# Patient Record
Sex: Female | Born: 1960
Health system: Southern US, Community
[De-identification: ages and names within clinical notes are randomized; demographics above are authoritative.]

---

## 2000-11-29 ENCOUNTER — Other Ambulatory Visit: Admission: RE | Admit: 2000-11-29 | Discharge: 2000-11-29 | Payer: Self-pay | Admitting: Obstetrics and Gynecology

## 2001-12-21 ENCOUNTER — Other Ambulatory Visit: Admission: RE | Admit: 2001-12-21 | Discharge: 2001-12-21 | Payer: Self-pay | Admitting: Obstetrics and Gynecology

## 2003-03-01 ENCOUNTER — Other Ambulatory Visit: Admission: RE | Admit: 2003-03-01 | Discharge: 2003-03-01 | Payer: Self-pay | Admitting: Obstetrics and Gynecology

## 2004-04-22 ENCOUNTER — Other Ambulatory Visit: Admission: RE | Admit: 2004-04-22 | Discharge: 2004-04-22 | Payer: Self-pay | Admitting: Obstetrics and Gynecology

## 2005-05-28 ENCOUNTER — Other Ambulatory Visit: Admission: RE | Admit: 2005-05-28 | Discharge: 2005-05-28 | Payer: Self-pay | Admitting: Obstetrics and Gynecology

## 2006-08-10 ENCOUNTER — Other Ambulatory Visit: Admission: RE | Admit: 2006-08-10 | Discharge: 2006-08-10 | Payer: Self-pay | Admitting: Obstetrics and Gynecology

## 2011-05-22 ENCOUNTER — Other Ambulatory Visit: Payer: Self-pay | Admitting: Family Medicine

## 2011-05-25 ENCOUNTER — Other Ambulatory Visit (HOSPITAL_COMMUNITY)
Admission: RE | Admit: 2011-05-25 | Discharge: 2011-05-25 | Disposition: A | Discharge status: Home / Self Care | Payer: BC Managed Care – PPO | Source: Ambulatory Visit | Attending: Family Medicine | Admitting: Family Medicine

## 2011-05-25 DIAGNOSIS — Z0000 Encounter for general adult medical examination without abnormal findings: Secondary | ICD-10-CM | POA: Insufficient documentation

## 2012-09-19 ENCOUNTER — Other Ambulatory Visit: Payer: Self-pay | Admitting: Gastroenterology

## 2014-05-15 ENCOUNTER — Other Ambulatory Visit (HOSPITAL_COMMUNITY)
Admission: RE | Admit: 2014-05-15 | Discharge: 2014-05-15 | Disposition: A | Discharge status: Home / Self Care | Payer: BLUE CROSS/BLUE SHIELD | Source: Ambulatory Visit | Attending: Family Medicine | Admitting: Family Medicine

## 2014-05-15 ENCOUNTER — Other Ambulatory Visit: Payer: Self-pay | Admitting: Family Medicine

## 2014-05-15 DIAGNOSIS — Z01419 Encounter for gynecological examination (general) (routine) without abnormal findings: Secondary | ICD-10-CM | POA: Insufficient documentation

## 2014-05-15 DIAGNOSIS — R599 Enlarged lymph nodes, unspecified: Secondary | ICD-10-CM

## 2014-05-16 LAB — CYTOLOGY - PAP

## 2014-05-21 ENCOUNTER — Ambulatory Visit
Admission: RE | Admit: 2014-05-21 | Discharge: 2014-05-21 | Disposition: A | Discharge status: Home / Self Care | Payer: BLUE CROSS/BLUE SHIELD | Source: Ambulatory Visit | Attending: Family Medicine | Admitting: Family Medicine

## 2014-05-21 DIAGNOSIS — R599 Enlarged lymph nodes, unspecified: Secondary | ICD-10-CM

## 2016-03-26 DIAGNOSIS — Z86010 Personal history of colonic polyps: Secondary | ICD-10-CM | POA: Diagnosis not present

## 2016-03-26 DIAGNOSIS — K640 First degree hemorrhoids: Secondary | ICD-10-CM | POA: Diagnosis not present

## 2016-03-26 DIAGNOSIS — D126 Benign neoplasm of colon, unspecified: Secondary | ICD-10-CM | POA: Diagnosis not present

## 2016-04-24 DIAGNOSIS — H34219 Partial retinal artery occlusion, unspecified eye: Secondary | ICD-10-CM | POA: Diagnosis not present

## 2016-10-06 DIAGNOSIS — Z803 Family history of malignant neoplasm of breast: Secondary | ICD-10-CM | POA: Diagnosis not present

## 2016-10-06 DIAGNOSIS — Z1231 Encounter for screening mammogram for malignant neoplasm of breast: Secondary | ICD-10-CM | POA: Diagnosis not present

## 2016-10-14 DIAGNOSIS — H5212 Myopia, left eye: Secondary | ICD-10-CM | POA: Diagnosis not present

## 2016-11-30 DIAGNOSIS — Z23 Encounter for immunization: Secondary | ICD-10-CM | POA: Diagnosis not present

## 2016-11-30 DIAGNOSIS — Z0000 Encounter for general adult medical examination without abnormal findings: Secondary | ICD-10-CM | POA: Diagnosis not present

## 2016-11-30 DIAGNOSIS — E782 Mixed hyperlipidemia: Secondary | ICD-10-CM | POA: Diagnosis not present

## 2017-12-14 ENCOUNTER — Other Ambulatory Visit (HOSPITAL_COMMUNITY)
Admission: RE | Admit: 2017-12-14 | Discharge: 2017-12-14 | Disposition: A | Discharge status: Home / Self Care | Payer: 59 | Source: Ambulatory Visit | Attending: Family Medicine | Admitting: Family Medicine

## 2017-12-14 ENCOUNTER — Other Ambulatory Visit: Payer: Self-pay | Admitting: Family Medicine

## 2017-12-14 DIAGNOSIS — E782 Mixed hyperlipidemia: Secondary | ICD-10-CM | POA: Diagnosis not present

## 2017-12-14 DIAGNOSIS — Z124 Encounter for screening for malignant neoplasm of cervix: Secondary | ICD-10-CM | POA: Insufficient documentation

## 2017-12-14 DIAGNOSIS — L659 Nonscarring hair loss, unspecified: Secondary | ICD-10-CM | POA: Diagnosis not present

## 2017-12-14 DIAGNOSIS — Z0000 Encounter for general adult medical examination without abnormal findings: Secondary | ICD-10-CM | POA: Diagnosis not present

## 2017-12-14 DIAGNOSIS — M25511 Pain in right shoulder: Secondary | ICD-10-CM | POA: Diagnosis not present

## 2017-12-15 LAB — CYTOLOGY - PAP
DIAGNOSIS: NEGATIVE
HPV: NOT DETECTED

## 2017-12-22 DIAGNOSIS — Z803 Family history of malignant neoplasm of breast: Secondary | ICD-10-CM | POA: Diagnosis not present

## 2017-12-22 DIAGNOSIS — Z1231 Encounter for screening mammogram for malignant neoplasm of breast: Secondary | ICD-10-CM | POA: Diagnosis not present

## 2020-10-22 ENCOUNTER — Other Ambulatory Visit: Payer: Self-pay

## 2020-10-22 ENCOUNTER — Ambulatory Visit
Admission: RE | Admit: 2020-10-22 | Discharge: 2020-10-22 | Disposition: A | Discharge status: Home / Self Care | Payer: BC Managed Care – PPO | Source: Ambulatory Visit | Attending: Sports Medicine | Admitting: Sports Medicine

## 2020-10-22 ENCOUNTER — Ambulatory Visit (INDEPENDENT_AMBULATORY_CARE_PROVIDER_SITE_OTHER): Payer: BC Managed Care – PPO | Admitting: Sports Medicine

## 2020-10-22 VITALS — Ht 65.0 in | Wt 172.0 lb

## 2020-10-22 DIAGNOSIS — M5416 Radiculopathy, lumbar region: Secondary | ICD-10-CM

## 2020-10-22 DIAGNOSIS — M79605 Pain in left leg: Secondary | ICD-10-CM | POA: Diagnosis not present

## 2020-10-22 NOTE — Progress Notes (Addendum)
PCP: Carol Ada, MD  Subjective:   HPI: Patient is a very pleasant 60 y.o. female here for evaluation of left hip/lateral thigh pain.  Patient presents today for left low back and anterior lateral thigh pain.  Patient states this has been bothering her over the last 2-3 months.  She states that a few years ago she was moving her daughter into an apartment and picked up a box and felt a somewhat sharp pain in the left lateral low back and thigh, although this got better on its own with conservative treatment.  Over the past few years the left lateral thigh has flared up occasionally but would always get better with rest and ibuprofen.  She does run about 2 miles twice a week, and over the last few months it has simply not gotten better. She states the pain is located over the anterolateral thigh and will shoot down the anterior part of the thigh to the knee.  She states here recently she is also having some pain in the left lateral low back as well.  Her pain is exacerbated by bending down and lifting up objects, running, and walking up and down steps.  She is taking ibuprofen as needed which does help the pain.  She is also resting.  She denies any radicular pain shooting down the back of the leg or into the foot.  She denies any weakness or giving out of the leg/hip.  No fever chills or signs of systemic illness.  No past medical history on file.  No current outpatient medications on file prior to visit.   No current facility-administered medications on file prior to visit.    Allergies  Allergen Reactions   Sulfa Antibiotics Hives    Ht '5\' 5"'$  (1.651 m)   Wt 172 lb (78 kg)   BMI 28.62 kg/m   [facility] 10/22/2020  Frequency of aerobic exercise (# of days/week) 2  Average time in minutes 30  Frequency of strengthening activities (# of days/week) 1    No flowsheet data found.      Objective:  Physical Exam:  Gen: Well-appearing, in no acute distress;  non-toxic CV: Regular Rate. Well-perfused. Warm.  Resp: Breathing unlabored on room air; no wheezing. Psych: Fluid speech in conversation; appropriate affect; normal thought process Neuro: Sensation intact throughout. No gross coordination deficits.  MSK:  - Left hip: no TTP noted over the left greater trochanter or ASIS; erythema, ecchymosis or edema.  Mild tenderness to palpation over the tensor fascia latae. Range of motion of the hip full in all directions; strength 5/5 in all directions, although lateral hip abduction slightly weaker.  Nonantalgic gait.  No SI joint tenderness.  Negative FADIR, FABER's test.  Negative Ober's, Noble's test.  - Lumbar spine:  - Inspection: no gross deformity or scoliosis; no swelling or ecchymosis. No skin changes - Palpation: No TTP over the spinous processes, paraspinal muscles, or SI joints b/l - ROM: full active ROM of the lumbar spine in flexion; + mild pain with extension of the spine; + restriction in right SB - Strength: 5/5 strength of lower extremity in L4-S1 nerve root distributions b/l  *L1/L2: Hip Flexion & Abduction  *L3/L4: Knee Extension  *L4/L5: Ankle Dorsiflexion  *L5: Great Toe Extension  *S1: Ankle Plantar Flexion - Neuro: sensation intact in the L4-S1 nerve root distribution b/l, 2+ L4 and S1 reflexes - Provocative Testing: Positive straight leg raise of left with symptom reproduction in L3 dermatome; Modified  Slump Test    Assessment & Plan:  1. Left anterolateral LLE pain 2. Left sided lumbar radiculopathy  Patient has had acute on chronic anterolateral hip pain that radiates down the L3 dermatome. Her symptoms and exam are most indicative of cervicogenic lumbar etiology. She has no weakness on exam or posterior sciatica or red flag symptoms.   - 3-views of the lumbar spine xray ordered - Referral to physical therapy for low back pain (she will be leaving next week to travel to [place] to help take care of her father who is  having open heart surgery.  We will expedite this referral so that she has some home exercises to do while she is away) -Discussed activity modification; she may use ice/heat or over-the-counter anti-inflammatories for pain control as needed -We will follow-up in about 5 weeks after 1 month of formal/home PT to see if improved  Elba Barman, DO PGY-4, Sports Medicine Fellow [place]  Patient seen and evaluated with the sports medicine fellow.  I agree with the above plan of care.  X-rays of the lumbar spine show a grade 1 anterior listhesis of L3 on L4.  She also has disc space narrowing at L2-L3 and L4-L5 with advanced degenerative changes at L5-S1.  Proceed with treatment as above and follow-up in 4 to 5 weeks.  Future considerations could include an MRI to evaluate degree of foraminal stenosis present specifically at the L2-L3 area where she is clinically experiencing symptoms.

## 2020-11-26 ENCOUNTER — Ambulatory Visit (INDEPENDENT_AMBULATORY_CARE_PROVIDER_SITE_OTHER): Payer: BC Managed Care – PPO | Admitting: Sports Medicine

## 2020-11-26 VITALS — Ht 65.0 in | Wt 169.0 lb

## 2020-11-26 DIAGNOSIS — M5416 Radiculopathy, lumbar region: Secondary | ICD-10-CM | POA: Diagnosis not present

## 2020-11-26 NOTE — Patient Instructions (Signed)
It was great to see you today!  Please call [facility] at [phone] to schedule the MRI of your low back. We will call you with results once available.  Call us with any questions in the meantime.

## 2020-11-27 NOTE — Progress Notes (Signed)
   Subjective:    Patient ID: Mary Hooper, female    DOB: Jan 08, 1961, 60 y.o.   MRN: [id number]  HPI  Mary Hooper presents today for follow-up on left-sided low back pain and left leg radiculopathy.  She has noticed some improvement with physical therapy but nothing significant.  Her pain is most noticeable when crouching down and getting up from a crouched position.  Sitting is also more painful than walking.  She continues to localize her pain to the left side of her lumbar spine with radiating pain down into the left anterior thigh as well as some pain past the the knee into the proximal lateral aspect of the lower leg.  She is not sure whether or not she has noticed any weakness.  No numbness.  Ibuprofen does seem to help some. Recent x-rays of her lumbar spine showed a grade 1 anterior listhesis of L3 on L4 as well as disc space narrowing at L2-L3 and L4-L5.  There were advanced degenerative disc changes at L5-S1.   Review of Systems As above    Objective:   Physical Exam  Developed, well nourished.  No acute distress  Good lumbar range of motion.  Equivocal straight leg raise test on the left.  Strength is 5/5 in both lower extremities.  Sensation is intact to light touch grossly.  No noticeable atrophy.  X-rays as above      Assessment & Plan:   Left-sided low back pain and left leg sciatica secondary to lumbar degenerative disc disease  Given her failure to improve with conservative treatment thus far, I recommend an MRI of her lumbar spine specifically to rule out lumbar disc herniation.  Her symptoms may be originating from either the L3 or L4 nerve root.  Phone follow-up with those results when available.  We will likely proceed with a diagnostic/therapeutic lumbar ESI thereafter.  In the meantime, Mary Hooper may continue with physical therapy if she finds it to be helpful but I have given her a note to give to her physical therapist with instructions to avoid any sort  of lumbar flexion exercises and to instead stick with spine neutral or McKenzie extension exercises.  I also explained to Mary Hooper that any sort of activity that involves a lot of crouching or repetitive lumbar flexion may cause her symptoms to worsen.  She understands.  This note was dictated using Dragon naturally speaking software and may contain errors in syntax, spelling, or content which have not been identified prior to signing this note.

## 2020-12-02 ENCOUNTER — Other Ambulatory Visit: Payer: BC Managed Care – PPO

## 2020-12-02 ENCOUNTER — Encounter: Payer: Self-pay | Admitting: *Deleted

## 2020-12-15 ENCOUNTER — Ambulatory Visit
Admission: RE | Admit: 2020-12-15 | Discharge: 2020-12-15 | Disposition: A | Discharge status: Home / Self Care | Payer: BC Managed Care – PPO | Source: Ambulatory Visit | Attending: Sports Medicine | Admitting: Sports Medicine

## 2020-12-15 DIAGNOSIS — M5416 Radiculopathy, lumbar region: Secondary | ICD-10-CM

## 2020-12-18 ENCOUNTER — Other Ambulatory Visit: Payer: Self-pay | Admitting: Sports Medicine

## 2020-12-18 ENCOUNTER — Telehealth: Payer: Self-pay | Admitting: Sports Medicine

## 2020-12-18 ENCOUNTER — Other Ambulatory Visit: Payer: Self-pay

## 2020-12-18 DIAGNOSIS — M5410 Radiculopathy, site unspecified: Secondary | ICD-10-CM

## 2020-12-18 DIAGNOSIS — M79605 Pain in left leg: Secondary | ICD-10-CM

## 2020-12-18 DIAGNOSIS — M5416 Radiculopathy, lumbar region: Secondary | ICD-10-CM

## 2020-12-18 NOTE — Telephone Encounter (Signed)
  I spoke with Mary Hooper on the phone today after reviewing the MRI of her lumbar spine.  She has multilevel degenerative changes including left-sided foraminal stenosis at L3-L4.  Clinically, I think that is where her symptoms are arising.  I recommended a diagnostic/therapeutic ESI at [facility] imaging directed at this level with a subsequent phone follow-up with me approximately a week later.  She will also continue with her physical therapy.  We will decide on further work-up and treatment based on her response to the diagnostic ESI.

## 2020-12-23 ENCOUNTER — Other Ambulatory Visit: Payer: Self-pay

## 2020-12-23 ENCOUNTER — Ambulatory Visit
Admission: RE | Admit: 2020-12-23 | Discharge: 2020-12-23 | Disposition: A | Discharge status: Home / Self Care | Payer: BC Managed Care – PPO | Source: Ambulatory Visit | Attending: Sports Medicine | Admitting: Sports Medicine

## 2020-12-23 DIAGNOSIS — M5410 Radiculopathy, site unspecified: Secondary | ICD-10-CM

## 2020-12-23 MED ORDER — IOPAMIDOL (ISOVUE-M 200) INJECTION 41%
1.0000 mL | Freq: Once | INTRAMUSCULAR | Status: AC
  Administered 2020-12-23: 1 mL via EPIDURAL

## 2020-12-23 MED ORDER — METHYLPREDNISOLONE ACETATE 40 MG/ML INJ SUSP (RADIOLOG
80.0000 mg | Freq: Once | INTRAMUSCULAR | Status: AC
  Administered 2020-12-23: 80 mg via EPIDURAL

## 2020-12-23 NOTE — Discharge Instructions (Signed)

## 2021-04-19 ENCOUNTER — Encounter: Payer: Self-pay | Admitting: Sports Medicine

## 2021-04-21 ENCOUNTER — Other Ambulatory Visit: Payer: Self-pay

## 2021-04-21 ENCOUNTER — Other Ambulatory Visit: Payer: Self-pay | Admitting: Sports Medicine

## 2021-04-21 DIAGNOSIS — M5416 Radiculopathy, lumbar region: Secondary | ICD-10-CM

## 2021-04-21 DIAGNOSIS — M79605 Pain in left leg: Secondary | ICD-10-CM

## 2021-04-22 ENCOUNTER — Other Ambulatory Visit: Payer: BC Managed Care – PPO

## 2021-04-24 ENCOUNTER — Other Ambulatory Visit: Payer: BC Managed Care – PPO

## 2021-04-25 ENCOUNTER — Ambulatory Visit
Admission: RE | Admit: 2021-04-25 | Discharge: 2021-04-25 | Disposition: A | Discharge status: Home / Self Care | Payer: BC Managed Care – PPO | Source: Ambulatory Visit | Attending: Sports Medicine | Admitting: Sports Medicine

## 2021-04-25 DIAGNOSIS — M5416 Radiculopathy, lumbar region: Secondary | ICD-10-CM

## 2021-04-25 MED ORDER — METHYLPREDNISOLONE ACETATE 40 MG/ML INJ SUSP (RADIOLOG
80.0000 mg | Freq: Once | INTRAMUSCULAR | Status: AC
  Administered 2021-04-25: 80 mg via EPIDURAL

## 2021-04-25 MED ORDER — IOPAMIDOL (ISOVUE-M 200) INJECTION 41%
1.0000 mL | Freq: Once | INTRAMUSCULAR | Status: AC
  Administered 2021-04-25: 1 mL via EPIDURAL

## 2021-04-25 NOTE — Discharge Instructions (Signed)

## 2021-07-16 ENCOUNTER — Encounter: Payer: Self-pay | Admitting: Sports Medicine

## 2021-07-17 ENCOUNTER — Other Ambulatory Visit: Payer: Self-pay

## 2021-07-17 ENCOUNTER — Other Ambulatory Visit: Payer: Self-pay | Admitting: Sports Medicine

## 2021-07-17 DIAGNOSIS — M79605 Pain in left leg: Secondary | ICD-10-CM

## 2021-07-17 DIAGNOSIS — M5416 Radiculopathy, lumbar region: Secondary | ICD-10-CM

## 2021-07-25 ENCOUNTER — Ambulatory Visit
Admission: RE | Admit: 2021-07-25 | Discharge: 2021-07-25 | Disposition: A | Discharge status: Home / Self Care | Payer: BC Managed Care – PPO | Source: Ambulatory Visit | Attending: Sports Medicine | Admitting: Sports Medicine

## 2021-07-25 DIAGNOSIS — M5416 Radiculopathy, lumbar region: Secondary | ICD-10-CM

## 2021-07-25 MED ORDER — IOPAMIDOL (ISOVUE-M 200) INJECTION 41%
1.0000 mL | Freq: Once | INTRAMUSCULAR | Status: AC
  Administered 2021-07-25: 1 mL via EPIDURAL

## 2021-07-25 MED ORDER — METHYLPREDNISOLONE ACETATE 40 MG/ML INJ SUSP (RADIOLOG
80.0000 mg | Freq: Once | INTRAMUSCULAR | Status: AC
  Administered 2021-07-25: 80 mg via EPIDURAL

## 2021-07-25 NOTE — Discharge Instructions (Signed)

## 2021-10-13 ENCOUNTER — Encounter: Payer: Self-pay | Admitting: Sports Medicine

## 2021-10-14 ENCOUNTER — Other Ambulatory Visit: Payer: Self-pay | Admitting: *Deleted

## 2021-10-14 DIAGNOSIS — M5416 Radiculopathy, lumbar region: Secondary | ICD-10-CM

## 2021-10-16 ENCOUNTER — Other Ambulatory Visit: Payer: Self-pay | Admitting: Sports Medicine

## 2021-10-16 DIAGNOSIS — M5416 Radiculopathy, lumbar region: Secondary | ICD-10-CM

## 2021-10-30 ENCOUNTER — Ambulatory Visit
Admission: RE | Admit: 2021-10-30 | Discharge: 2021-10-30 | Disposition: A | Discharge status: Home / Self Care | Payer: BC Managed Care – PPO | Source: Ambulatory Visit | Attending: Sports Medicine | Admitting: Sports Medicine

## 2021-10-30 DIAGNOSIS — M5416 Radiculopathy, lumbar region: Secondary | ICD-10-CM

## 2021-10-30 MED ORDER — IOPAMIDOL (ISOVUE-M 200) INJECTION 41%
1.0000 mL | Freq: Once | INTRAMUSCULAR | Status: AC
  Administered 2021-10-30: 1 mL via EPIDURAL

## 2021-10-30 MED ORDER — METHYLPREDNISOLONE ACETATE 40 MG/ML INJ SUSP (RADIOLOG
80.0000 mg | Freq: Once | INTRAMUSCULAR | Status: AC
  Administered 2021-10-30: 80 mg via EPIDURAL

## 2021-10-30 NOTE — Discharge Instructions (Signed)

## 2022-11-29 IMAGING — XA Imaging study
1 series · 1 of 1 positions shown · non-contrast
Comparison: none

CLINICAL DATA: Lumbosacral spondylosis without myelopathy with
radiculopathy. Left hip and thigh pain to the knee. Minimal low back
pain. Moderate stenosis at L3-L4. No prior injections or surgery.

[Series 3: ortho standard · 1 of 1 slices shown]
[im 1/1]
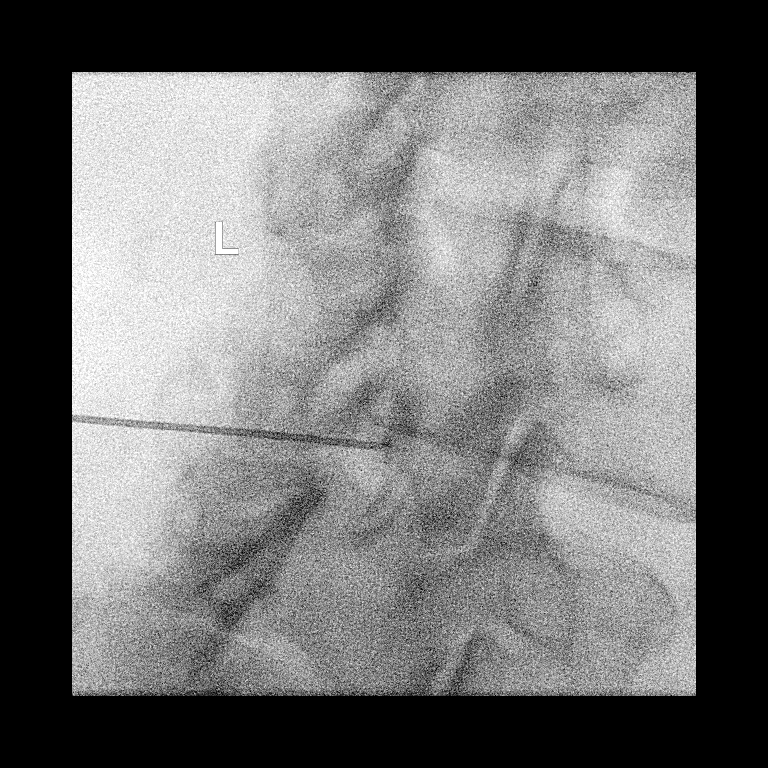

[1 of 1 positions shown; findings below may reference images not displayed]

FLUOROSCOPY TIME:  Radiation Exposure Index (as provided by the
fluoroscopic device): 2.8 mGy

Fluoroscopy Time:  31 seconds

Number of Acquired Images:  0

PROCEDURE:
The procedure, risks, benefits, and alternatives were explained to
the patient. Questions regarding the procedure were encouraged and
answered. The patient understands and consents to the procedure.

LUMBAR EPIDURAL INJECTION:

An interlaminar approach was performed on the left at L3-L4. The
overlying skin was cleansed and anesthetized. A 3.5 inch 20 gauge
epidural needle was advanced using loss-of-resistance technique.

DIAGNOSTIC EPIDURAL INJECTION:

Injection of Isovue-M 200 shows a good epidural pattern with spread
above and below the level of needle placement in the midline and to
both sides. No vascular opacification is seen.

THERAPEUTIC EPIDURAL INJECTION:

80 mg of Depo-Medrol mixed with 3 mL of 1% lidocaine were instilled.
The procedure was well-tolerated, and the patient was discharged
thirty minutes following the injection in good condition.

COMPLICATIONS:
None immediate.
IMPRESSION: Technically successful interlaminar epidural injection on the left
at L3-L4.
